# Patient Record
Sex: Female | Born: 1995 | Race: White | Hispanic: No | Marital: Married | Smoking: Current every day smoker
Health system: Southern US, Community
[De-identification: ages and names within clinical notes are randomized; demographics above are authoritative.]

---

## 2020-09-30 ENCOUNTER — Emergency Department (HOSPITAL_COMMUNITY): Payer: 59

## 2020-09-30 ENCOUNTER — Encounter (HOSPITAL_COMMUNITY): Payer: Self-pay

## 2020-09-30 ENCOUNTER — Emergency Department (HOSPITAL_COMMUNITY)
Admission: EM | Admit: 2020-09-30 | Discharge: 2020-09-30 | Disposition: A | Discharge status: Home / Self Care | Payer: 59 | Attending: Student | Admitting: Student

## 2020-09-30 ENCOUNTER — Other Ambulatory Visit: Payer: Self-pay

## 2020-09-30 DIAGNOSIS — M722 Plantar fascial fibromatosis: Secondary | ICD-10-CM | POA: Insufficient documentation

## 2020-09-30 DIAGNOSIS — F17210 Nicotine dependence, cigarettes, uncomplicated: Secondary | ICD-10-CM | POA: Diagnosis not present

## 2020-09-30 DIAGNOSIS — M7731 Calcaneal spur, right foot: Secondary | ICD-10-CM

## 2020-09-30 DIAGNOSIS — M25571 Pain in right ankle and joints of right foot: Secondary | ICD-10-CM | POA: Diagnosis present

## 2020-09-30 MED ORDER — DICLOFENAC SODIUM 75 MG PO TBEC: 75.0000 mg | DELAYED_RELEASE_TABLET | Freq: Two times a day (BID) | ORAL | 0 refills | Status: AC

## 2020-09-30 NOTE — ED Triage Notes (Signed)
Pt to er, pt states that she has had some foot pain for the past couple of months, states that today when she was going into work she had increased pain, states that it is her R foot that huts and is worse when she stands on it.

## 2020-09-30 NOTE — ED Provider Notes (Signed)
[facility] EMERGENCY DEPARTMENT Provider Note   CSN: [id number] Arrival date & time: 09/30/20  9381     History Chief Complaint  Patient presents with   Ankle Pain    Jordan Silva is a 25 y.o. female.  Pt complains of pain in the bottom of her foot and ankle.  Pt reports pain with walking.  Pt reports standing and walking in steel toed boots for 12 hour days    The history is provided by the patient. No language interpreter was used.  Ankle Pain Location:  Ankle and foot Time since incident:  1 week Pain details:    Quality:  Aching   Radiates to:  Does not radiate   Severity:  Moderate   Onset quality:  Gradual   Timing:  Constant   Progression:  Worsening Dislocation: no   Relieved by:  Nothing Ineffective treatments:  None tried     History reviewed. No pertinent past medical history.  There are no problems to display for this patient.   History reviewed. No pertinent surgical history.   OB History   No obstetric history on file.     History reviewed. No pertinent family history.  Social History   Tobacco Use   Smoking status: Every Day    Packs/day: 0.25    Types: Cigarettes   Smokeless tobacco: Never  Vaping Use   Vaping Use: Never used  Substance Use Topics   Alcohol use: Not Currently   Drug use: Yes    Types: Marijuana    Home Medications Prior to Admission medications   Medication Sig Start Date End Date Taking? Authorizing Provider  diclofenac (VOLTAREN) 75 MG EC tablet Take 1 tablet (75 mg total) by mouth 2 (two) times daily. 09/30/20  Yes Elson Areas, PA-C    Allergies    Patient has no known allergies.  Review of Systems   Review of Systems  All other systems reviewed and are negative.  Physical Exam Updated Vital Signs BP 123/79 (BP Location: Right Arm)   Pulse 60   Temp 98.2 F (36.8 C) (Oral)   Resp 18   Ht 5\' 9"  (1.753 m)   Wt 99.8 kg   SpO2 98%   BMI 32.49 kg/m   Physical Exam Vitals reviewed.   Constitutional:      Appearance: Normal appearance.  Musculoskeletal:        General: Swelling and tenderness present.     Comments: Tender bottom of foot  tender 5th metatarsal area,  from  nv and ns intact   Skin:    General: Skin is warm.  Neurological:     General: No focal deficit present.     Mental Status: She is alert.  Psychiatric:        Mood and Affect: Mood normal.    ED Results / Procedures / Treatments   Labs (all labs ordered are listed, but only abnormal results are displayed) Labs Reviewed - No data to display  EKG None  Radiology DG Foot Complete Right  Result Date: 09/30/2020 CLINICAL DATA:  Right foot pain.  No known injury. EXAM: RIGHT FOOT COMPLETE - 3+ VIEW COMPARISON:  None. FINDINGS: No fracture or dislocation. Joint spaces are preserved. No significant hallux valgus deformity. No erosions. Moderate to large sized plantar calcaneal spur. Enthesopathic change involving the Achilles tendon insertion site. Regional soft tissues appear normal. IMPRESSION: Moderate to large sized plantar calcaneal spur and enthesopathic change involving the Achilles tendon insertion site. Otherwise, no explanation  for patient's right foot pain. Electronically Signed   By: Simonne Come M.D.   On: 09/30/2020 10:32    Procedures Procedures   Medications Ordered in ED Medications - No data to display  ED Course  I have reviewed the triage vital signs and the nursing notes.  Pertinent labs & imaging results that were available during my care of the patient were reviewed by me and considered in my medical decision making (see chart for details).    MDM Rules/Calculators/A&P                           MDM:  Pt counseled on plantar fascitis and heel spurs.  Final Clinical Impression(s) / ED Diagnoses Final diagnoses:  Plantar fasciitis  Calcaneal spur of right foot    Rx / DC Orders ED Discharge Orders          Ordered    diclofenac (VOLTAREN) 75 MG EC tablet  2 times  daily        09/30/20 1110          Follow up with Podiatrist for recheck    Osie Cheeks 09/30/20 1502    Kommor, Wyn Forster, MD 09/30/20 1557

## 2020-09-30 NOTE — Discharge Instructions (Addendum)
Use  shoe inserts.  Do exercises.  Follow up with Dr. Nolen Mu

## 2022-09-13 IMAGING — DX DG FOOT COMPLETE 3+V*R*
3 series · 3 of 3 positions shown · non-contrast
Comparison: None.

CLINICAL DATA: Right foot pain.  No known injury.

EXAM:
RIGHT FOOT COMPLETE - 3+ VIEW

[foot ap]
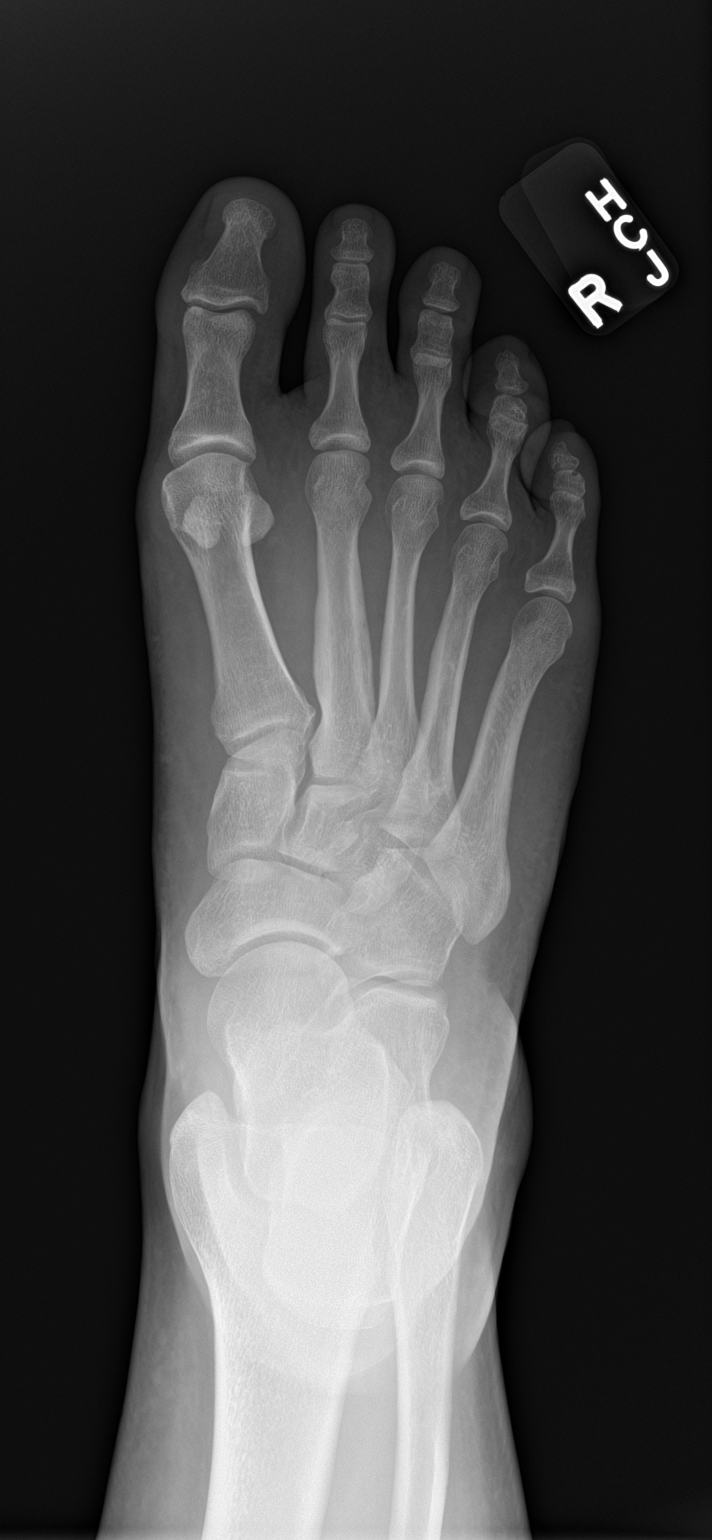

[foot obl]
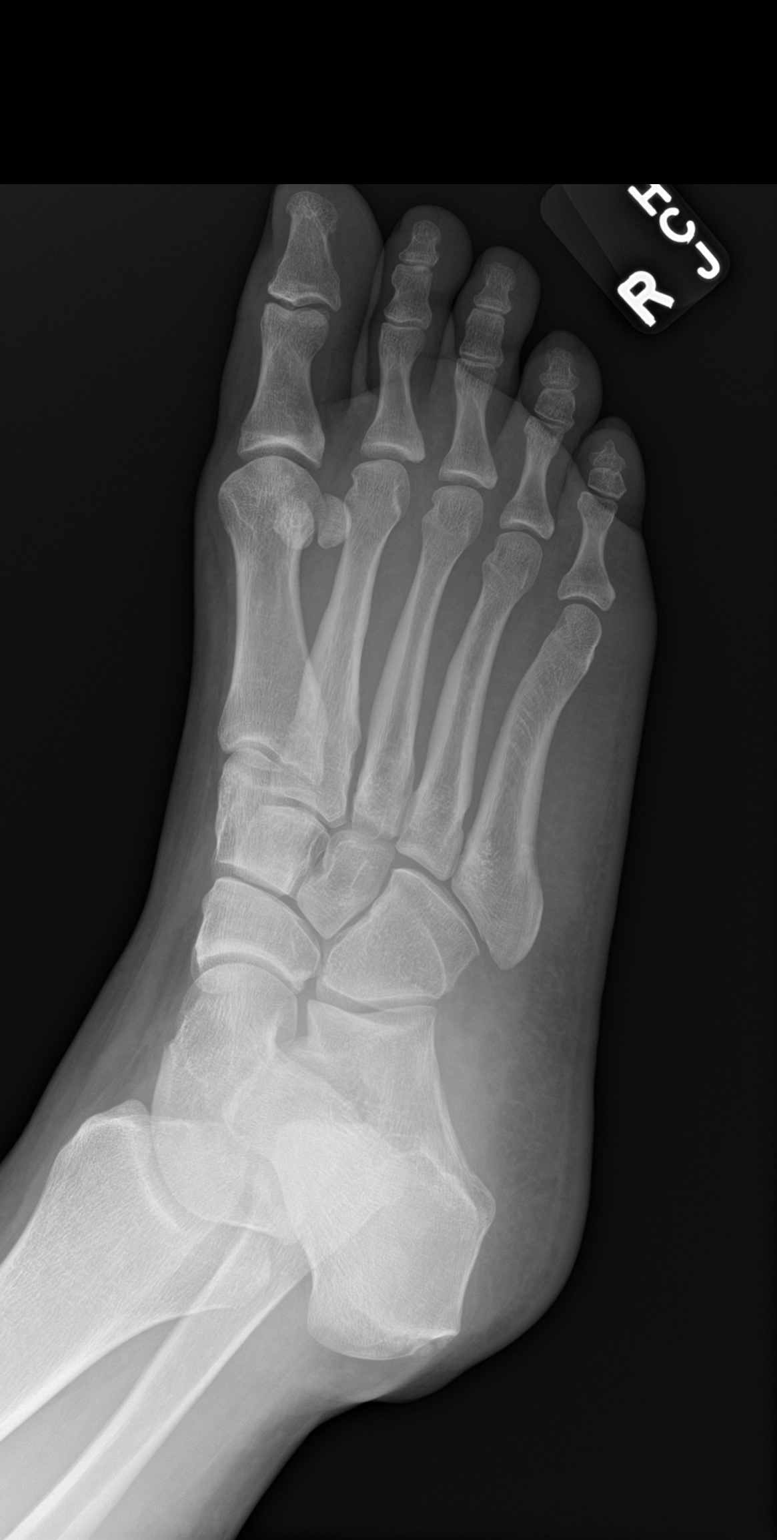

[foot lat]
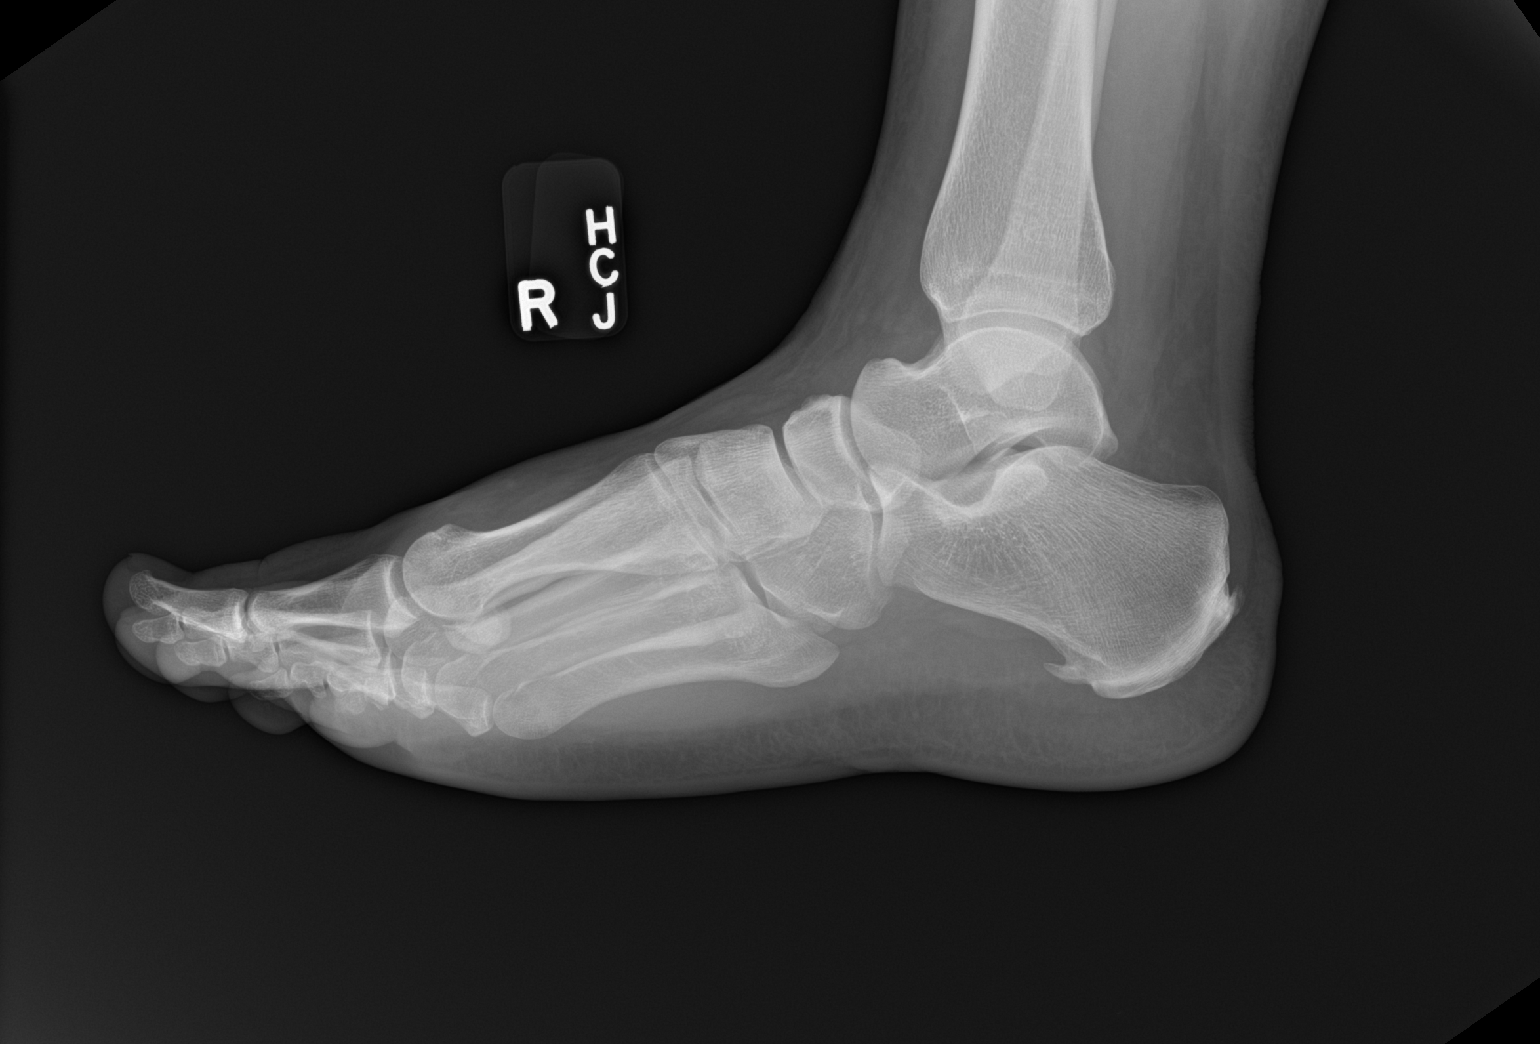

[3 of 3 positions shown; findings below may reference images not displayed]

FINDINGS: No fracture or dislocation. Joint spaces are preserved. No
significant hallux valgus deformity. No erosions. Moderate to large
sized plantar calcaneal spur. Enthesopathic change involving the
Achilles tendon insertion site. Regional soft tissues appear normal.
IMPRESSION: Moderate to large sized plantar calcaneal spur and enthesopathic
change involving the Achilles tendon insertion site. Otherwise, no
explanation for patient's right foot pain.
